# Patient Record
Sex: Female | Born: 1965 | Race: Black or African American | Hispanic: No | Marital: Single | Smoking: Never smoker
Health system: Southern US, Community
[De-identification: ages and names within clinical notes are randomized; demographics above are authoritative.]

---

## 2007-05-25 ENCOUNTER — Emergency Department (HOSPITAL_COMMUNITY): Admission: EM | Admit: 2007-05-25 | Discharge: 2007-05-26 | Payer: Self-pay | Admitting: Emergency Medicine

## 2009-04-28 ENCOUNTER — Emergency Department (HOSPITAL_COMMUNITY): Admission: EM | Admit: 2009-04-28 | Discharge: 2009-04-28 | Payer: Self-pay | Admitting: Emergency Medicine

## 2009-05-04 ENCOUNTER — Encounter (INDEPENDENT_AMBULATORY_CARE_PROVIDER_SITE_OTHER): Payer: Self-pay | Admitting: Internal Medicine

## 2009-05-04 ENCOUNTER — Inpatient Hospital Stay (HOSPITAL_COMMUNITY): Admission: EM | Admit: 2009-05-04 | Discharge: 2009-05-06 | Payer: Self-pay | Admitting: Emergency Medicine

## 2009-05-04 ENCOUNTER — Ambulatory Visit: Payer: Self-pay | Admitting: Cardiology

## 2009-06-19 ENCOUNTER — Encounter: Admission: RE | Admit: 2009-06-19 | Discharge: 2009-06-19 | Payer: Self-pay | Admitting: Family Medicine

## 2009-06-28 ENCOUNTER — Emergency Department (HOSPITAL_COMMUNITY): Admission: EM | Admit: 2009-06-28 | Discharge: 2009-06-28 | Payer: Self-pay | Admitting: Emergency Medicine

## 2010-01-16 ENCOUNTER — Emergency Department (HOSPITAL_COMMUNITY): Admission: EM | Admit: 2010-01-16 | Discharge: 2010-01-16 | Payer: Self-pay | Admitting: Emergency Medicine

## 2010-04-12 ENCOUNTER — Emergency Department (HOSPITAL_COMMUNITY): Admission: EM | Admit: 2010-04-12 | Discharge: 2010-04-12 | Payer: Self-pay | Admitting: Emergency Medicine

## 2010-04-17 ENCOUNTER — Ambulatory Visit (HOSPITAL_COMMUNITY)
Admission: RE | Admit: 2010-04-17 | Discharge: 2010-04-17 | Payer: Self-pay | Source: Home / Self Care | Admitting: Family Medicine

## 2010-06-23 ENCOUNTER — Encounter: Payer: Self-pay | Admitting: Family Medicine

## 2010-08-13 LAB — COMPREHENSIVE METABOLIC PANEL
ALT: 16 U/L (ref 0–35)
AST: 24 U/L (ref 0–37)
Alkaline Phosphatase: 85 U/L (ref 39–117)
BUN: 8 mg/dL (ref 6–23)
CO2: 25 mEq/L (ref 19–32)
Calcium: 9.5 mg/dL (ref 8.4–10.5)
Chloride: 107 mEq/L (ref 96–112)
Creatinine, Ser: 1.07 mg/dL (ref 0.4–1.2)
Total Protein: 7.2 g/dL (ref 6.0–8.3)

## 2010-08-13 LAB — DIFFERENTIAL
Basophils Relative: 1 % (ref 0–1)
Eosinophils Absolute: 0.3 10*3/uL (ref 0.0–0.7)
Eosinophils Relative: 3 % (ref 0–5)
Lymphs Abs: 4.5 10*3/uL — ABNORMAL HIGH (ref 0.7–4.0)
Monocytes Absolute: 0.5 10*3/uL (ref 0.1–1.0)
Monocytes Relative: 5 % (ref 3–12)
Neutrophils Relative %: 47 % (ref 43–77)

## 2010-08-13 LAB — CBC
MCH: 30.1 pg (ref 26.0–34.0)
MCV: 89.9 fL (ref 78.0–100.0)
Platelets: 239 10*3/uL (ref 150–400)
RBC: 4.3 MIL/uL (ref 3.87–5.11)

## 2010-08-13 LAB — POCT CARDIAC MARKERS
CKMB, poc: <1 ng/mL — ABNORMAL LOW (ref 1.0–8.0)
Myoglobin, poc: 49.3 ng/mL (ref 12–200)
Troponin i, poc: <0.05 ng/mL (ref 0.00–0.09)
Troponin i, poc: <0.05 ng/mL (ref 0.00–0.09)

## 2010-08-16 LAB — POCT I-STAT, CHEM 8
Hemoglobin: 12.9 g/dL (ref 12.0–15.0)
Potassium: 3.9 mEq/L (ref 3.5–5.1)
Sodium: 139 mEq/L (ref 135–145)
TCO2: 25 mmol/L (ref 0–100)

## 2010-08-16 LAB — DIFFERENTIAL
Basophils Absolute: 0 10*3/uL (ref 0.0–0.1)
Lymphocytes Relative: 39 % (ref 12–46)
Lymphs Abs: 2.7 10*3/uL (ref 0.7–4.0)
Monocytes Absolute: 0.4 10*3/uL (ref 0.1–1.0)
Neutro Abs: 3.7 10*3/uL (ref 1.7–7.7)

## 2010-08-16 LAB — URINALYSIS, ROUTINE W REFLEX MICROSCOPIC
Bilirubin Urine: NEGATIVE
Ketones, ur: NEGATIVE mg/dL
Nitrite: NEGATIVE
Protein, ur: NEGATIVE mg/dL
Urobilinogen, UA: 1 mg/dL (ref 0.0–1.0)
pH: 7.5 (ref 5.0–8.0)

## 2010-08-16 LAB — PROTIME-INR: Prothrombin Time: 13.1 seconds (ref 11.6–15.2)

## 2010-08-16 LAB — CBC
HCT: 35.8 % — ABNORMAL LOW (ref 36.0–46.0)
RBC: 4.09 MIL/uL (ref 3.87–5.11)
RDW: 13.4 % (ref 11.5–15.5)
WBC: 7.1 10*3/uL (ref 4.0–10.5)

## 2010-08-16 LAB — URINE CULTURE: Culture  Setup Time: 201108172008

## 2010-08-16 LAB — APTT: aPTT: 29 seconds (ref 24–37)

## 2010-09-03 LAB — COMPREHENSIVE METABOLIC PANEL
ALT: <8 U/L (ref 0–35)
AST: 28 U/L (ref 0–37)
Alkaline Phosphatase: 103 U/L (ref 39–117)
CO2: 19 mEq/L (ref 19–32)
Chloride: 105 mEq/L (ref 96–112)
GFR calc Af Amer: >60 mL/min (ref >60)
GFR calc non Af Amer: 56 mL/min — ABNORMAL LOW (ref >60)
Potassium: 3.8 mEq/L (ref 3.5–5.1)
Sodium: 137 mEq/L (ref 135–145)
Total Bilirubin: 0.4 mg/dL (ref 0.3–1.2)

## 2010-09-03 LAB — DIFFERENTIAL
Basophils Absolute: 0.1 10*3/uL (ref 0.0–0.1)
Basophils Relative: 1 % (ref 0–1)
Eosinophils Absolute: 0 10*3/uL (ref 0.0–0.7)
Eosinophils Relative: 0 % (ref 0–5)
Lymphs Abs: 0.7 10*3/uL (ref 0.7–4.0)

## 2010-09-03 LAB — BASIC METABOLIC PANEL
BUN: 10 mg/dL (ref 6–23)
CO2: 23 mEq/L (ref 19–32)
Calcium: 8.8 mg/dL (ref 8.4–10.5)
Creatinine, Ser: 0.84 mg/dL (ref 0.4–1.2)
GFR calc Af Amer: >60 mL/min (ref >60)
Glucose, Bld: 137 mg/dL — ABNORMAL HIGH (ref 70–99)

## 2010-09-03 LAB — CBC
RBC: 4.56 MIL/uL (ref 3.87–5.11)
WBC: 11 10*3/uL — ABNORMAL HIGH (ref 4.0–10.5)

## 2010-09-03 LAB — CARDIAC PANEL(CRET KIN+CKTOT+MB+TROPI)
CK, MB: 5.1 ng/mL — ABNORMAL HIGH (ref 0.3–4.0)
Relative Index: 3.5 — ABNORMAL HIGH (ref 0.0–2.5)
Relative Index: 4.1 — ABNORMAL HIGH (ref 0.0–2.5)
Total CK: 123 U/L (ref 7–177)
Total CK: 137 U/L (ref 7–177)
Troponin I: 0.07 ng/mL — ABNORMAL HIGH (ref 0.00–0.06)

## 2010-09-03 LAB — D-DIMER, QUANTITATIVE: D-Dimer, Quant: 0.55 ug/mL-FEU — ABNORMAL HIGH (ref 0.00–0.48)

## 2011-11-11 ENCOUNTER — Emergency Department (HOSPITAL_COMMUNITY)
Admission: EM | Admit: 2011-11-11 | Discharge: 2011-11-12 | Disposition: A | Discharge status: Home / Self Care | Payer: BC Managed Care – PPO | Attending: Emergency Medicine | Admitting: Emergency Medicine

## 2011-11-11 ENCOUNTER — Encounter (HOSPITAL_COMMUNITY): Payer: Self-pay | Admitting: *Deleted

## 2011-11-11 DIAGNOSIS — R110 Nausea: Secondary | ICD-10-CM | POA: Insufficient documentation

## 2011-11-11 DIAGNOSIS — R51 Headache: Secondary | ICD-10-CM | POA: Insufficient documentation

## 2011-11-11 NOTE — ED Notes (Signed)
Pt c/o headache; ringing in ears; nauseated; elevated bp today; symptoms started yesterday; pt took norvasc/metoprolol prior to arrival (mother's meds)

## 2011-11-12 ENCOUNTER — Emergency Department (HOSPITAL_COMMUNITY): Payer: BC Managed Care – PPO

## 2011-11-12 LAB — BASIC METABOLIC PANEL
BUN: 9 mg/dL (ref 6–23)
CO2: 24 mEq/L (ref 19–32)
Chloride: 103 mEq/L (ref 96–112)
Glucose, Bld: 103 mg/dL — ABNORMAL HIGH (ref 70–99)
Potassium: 3.9 mEq/L (ref 3.5–5.1)

## 2011-11-12 LAB — URINALYSIS, ROUTINE W REFLEX MICROSCOPIC
Bilirubin Urine: NEGATIVE
Glucose, UA: NEGATIVE mg/dL
Protein, ur: NEGATIVE mg/dL
Specific Gravity, Urine: 1.009 (ref 1.005–1.030)
Urobilinogen, UA: 0.2 mg/dL (ref 0.0–1.0)

## 2011-11-12 LAB — DIFFERENTIAL
Basophils Relative: 0 % (ref 0–1)
Eosinophils Absolute: 0.2 10*3/uL (ref 0.0–0.7)
Lymphs Abs: 2.4 10*3/uL (ref 0.7–4.0)
Monocytes Absolute: 0.4 10*3/uL (ref 0.1–1.0)
Monocytes Relative: 6 % (ref 3–12)

## 2011-11-12 LAB — URINE MICROSCOPIC-ADD ON

## 2011-11-12 LAB — CBC
HCT: 36.8 % (ref 36.0–46.0)
Hemoglobin: 12.1 g/dL (ref 12.0–15.0)
MCH: 27.8 pg (ref 26.0–34.0)
MCHC: 32.9 g/dL (ref 30.0–36.0)
RBC: 4.36 MIL/uL (ref 3.87–5.11)

## 2011-11-12 MED ORDER — ONDANSETRON HCL 4 MG/2ML IJ SOLN
INTRAMUSCULAR | Status: AC
  Administered 2011-11-12: 4 mg
  Filled 2011-11-12: qty 2

## 2011-11-12 MED ORDER — HYDROMORPHONE HCL PF 1 MG/ML IJ SOLN
INTRAMUSCULAR | Status: AC
  Administered 2011-11-12: 1 mg
  Filled 2011-11-12: qty 1

## 2011-11-12 NOTE — ED Notes (Signed)
Pt reports that she started having "pounding headache" yesterday that was not relieved by Aleve and Baby Aspirin--- took her BP with her mom's BP apparatus and she has had 165/100. Pt c/o constant headache and "ringing in her ears".

## 2011-11-12 NOTE — Discharge Instructions (Signed)
Headaches, Frequently Asked Questions MIGRAINE HEADACHES Q: What is migraine? What causes it? How can I treat it? A: Generally, migraine headaches begin as a dull ache. Then they develop into a constant, throbbing, and pulsating pain. You may experience pain at the temples. You may experience pain at the front or back of one or both sides of the head. The pain is usually accompanied by a combination of:  Nausea.   Vomiting.   Sensitivity to light and noise.  Some people (about 15%) experience an aura (see below) before an attack. The cause of migraine is believed to be chemical reactions in the brain. Treatment for migraine may include over-the-counter or prescription medications. It may also include self-help techniques. These include relaxation training and biofeedback.  Q: What is an aura? A: About 15% of people with migraine get an "aura". This is a sign of neurological symptoms that occur before a migraine headache. You may see wavy or jagged lines, dots, or flashing lights. You might experience tunnel vision or blind spots in one or both eyes. The aura can include visual or auditory hallucinations (something imagined). It may include disruptions in smell (such as strange odors), taste or touch. Other symptoms include:  Numbness.   A "pins and needles" sensation.   Difficulty in recalling or speaking the correct word.  These neurological events may last as long as 60 minutes. These symptoms will fade as the headache begins. Q: What is a trigger? A: Certain physical or environmental factors can lead to or "trigger" a migraine. These include:  Foods.   Hormonal changes.   Weather.   Stress.  It is important to remember that triggers are different for everyone. To help prevent migraine attacks, you need to figure out which triggers affect you. Keep a headache diary. This is a good way to track triggers. The diary will help you talk to your healthcare professional about your  condition. Q: Does weather affect migraines? A: Bright sunshine, hot, humid conditions, and drastic changes in barometric pressure may lead to, or "trigger," a migraine attack in some people. But studies have shown that weather does not act as a trigger for everyone with migraines. Q: What is the link between migraine and hormones? A: Hormones start and regulate many of your body's functions. Hormones keep your body in balance within a constantly changing environment. The levels of hormones in your body are unbalanced at times. Examples are during menstruation, pregnancy, or menopause. That can lead to a migraine attack. In fact, about three quarters of all women with migraine report that their attacks are related to the menstrual cycle.  Q: Is there an increased risk of stroke for migraine sufferers? A: The likelihood of a migraine attack causing a stroke is very remote. That is not to say that migraine sufferers cannot have a stroke associated with their migraines. In persons under age 40, the most common associated factor for stroke is migraine headache. But over the course of a person's normal life span, the occurrence of migraine headache may actually be associated with a reduced risk of dying from cerebrovascular disease due to stroke.  Q: What are acute medications for migraine? A: Acute medications are used to treat the pain of the headache after it has started. Examples over-the-counter medications, NSAIDs, ergots, and triptans.  Q: What are the triptans? A: Triptans are the newest class of abortive medications. They are specifically targeted to treat migraine. Triptans are vasoconstrictors. They moderate some chemical reactions in the brain.   The triptans work on receptors in your brain. Triptans help to restore the balance of a neurotransmitter called serotonin. Fluctuations in levels of serotonin are thought to be a main cause of migraine.  Q: Are over-the-counter medications for migraine  effective? A: Over-the-counter, or "OTC," medications may be effective in relieving mild to moderate pain and associated symptoms of migraine. But you should see your caregiver before beginning any treatment regimen for migraine.  Q: What are preventive medications for migraine? A: Preventive medications for migraine are sometimes referred to as "prophylactic" treatments. They are used to reduce the frequency, severity, and length of migraine attacks. Examples of preventive medications include antiepileptic medications, antidepressants, beta-blockers, calcium channel blockers, and NSAIDs (nonsteroidal anti-inflammatory drugs). Q: Why are anticonvulsants used to treat migraine? A: During the past few years, there has been an increased interest in antiepileptic drugs for the prevention of migraine. They are sometimes referred to as "anticonvulsants". Both epilepsy and migraine may be caused by similar reactions in the brain.  Q: Why are antidepressants used to treat migraine? A: Antidepressants are typically used to treat people with depression. They may reduce migraine frequency by regulating chemical levels, such as serotonin, in the brain.  Q: What alternative therapies are used to treat migraine? A: The term "alternative therapies" is often used to describe treatments considered outside the scope of conventional Western medicine. Examples of alternative therapy include acupuncture, acupressure, and yoga. Another common alternative treatment is herbal therapy. Some herbs are believed to relieve headache pain. Always discuss alternative therapies with your caregiver before proceeding. Some herbal products contain arsenic and other toxins. TENSION HEADACHES Q: What is a tension-type headache? What causes it? How can I treat it? A: Tension-type headaches occur randomly. They are often the result of temporary stress, anxiety, fatigue, or anger. Symptoms include soreness in your temples, a tightening  band-like sensation around your head (a "vice-like" ache). Symptoms can also include a pulling feeling, pressure sensations, and contracting head and neck muscles. The headache begins in your forehead, temples, or the back of your head and neck. Treatment for tension-type headache may include over-the-counter or prescription medications. Treatment may also include self-help techniques such as relaxation training and biofeedback. CLUSTER HEADACHES Q: What is a cluster headache? What causes it? How can I treat it? A: Cluster headache gets its name because the attacks come in groups. The pain arrives with little, if any, warning. It is usually on one side of the head. A tearing or bloodshot eye and a runny nose on the same side of the headache may also accompany the pain. Cluster headaches are believed to be caused by chemical reactions in the brain. They have been described as the most severe and intense of any headache type. Treatment for cluster headache includes prescription medication and oxygen. SINUS HEADACHES Q: What is a sinus headache? What causes it? How can I treat it? A: When a cavity in the bones of the face and skull (a sinus) becomes inflamed, the inflammation will cause localized pain. This condition is usually the result of an allergic reaction, a tumor, or an infection. If your headache is caused by a sinus blockage, such as an infection, you will probably have a fever. An x-ray will confirm a sinus blockage. Your caregiver's treatment might include antibiotics for the infection, as well as antihistamines or decongestants.  REBOUND HEADACHES Q: What is a rebound headache? What causes it? How can I treat it? A: A pattern of taking acute headache medications too   often can lead to a condition known as "rebound headache." A pattern of taking too much headache medication includes taking it more than 2 days per week or in excessive amounts. That means more than the label or a caregiver advises.  With rebound headaches, your medications not only stop relieving pain, they actually begin to cause headaches. Doctors treat rebound headache by tapering the medication that is being overused. Sometimes your caregiver will gradually substitute a different type of treatment or medication. Stopping may be a challenge. Regularly overusing a medication increases the potential for serious side effects. Consult a caregiver if you regularly use headache medications more than 2 days per week or more than the label advises. ADDITIONAL QUESTIONS AND ANSWERS Q: What is biofeedback? A: Biofeedback is a self-help treatment. Biofeedback uses special equipment to monitor your body's involuntary physical responses. Biofeedback monitors:  Breathing.   Pulse.   Heart rate.   Temperature.   Muscle tension.   Brain activity.  Biofeedback helps you refine and perfect your relaxation exercises. You learn to control the physical responses that are related to stress. Once the technique has been mastered, you do not need the equipment any more. Q: Are headaches hereditary? A: Four out of five (80%) of people that suffer report a family history of migraine. Scientists are not sure if this is genetic or a family predisposition. Despite the uncertainty, a child has a 50% chance of having migraine if one parent suffers. The child has a 75% chance if both parents suffer.  Q: Can children get headaches? A: By the time they reach high school, most young people have experienced some type of headache. Many safe and effective approaches or medications can prevent a headache from occurring or stop it after it has begun.  Q: What type of doctor should I see to diagnose and treat my headache? A: Start with your primary caregiver. Discuss his or her experience and approach to headaches. Discuss methods of classification, diagnosis, and treatment. Your caregiver may decide to recommend you to a headache specialist, depending upon  your symptoms or other physical conditions. Having diabetes, allergies, etc., may require a more comprehensive and inclusive approach to your headache. The [employer] will provide, upon request, a list of NHF physician members in your state. Document Released: 08/09/2003 Document Revised: 05/08/2011 Document Reviewed: 01/17/2008 Exit[employer] Patient Information 2012 [employer].Headache, General, Unknown Cause The specific cause of your headache may not have been found today. There are many causes and types of headache. A few common ones are:  Tension headache.   Migraine.   Infections (examples: dental and sinus infections).   Bone and/or joint problems in the neck or jaw.   Depression.   Eye problems.  These headaches are not life threatening.  Headaches can sometimes be diagnosed by a patient history and a physical exam. Sometimes, lab and imaging studies (such as x-ray and/or CT scan) are used to rule out more serious problems. In some cases, a spinal tap (lumbar puncture) may be requested. There are many times when your exam and tests may be normal on the first visit even when there is a serious problem causing your headaches. Because of that, it is very important to follow up with your doctor or local clinic for further evaluation. FINDING OUT THE RESULTS OF TESTS  If a radiology test was performed, a radiologist will review your results.   You will be contacted by the emergency department or your physician if any test results   require a change in your treatment plan.   Not all test results may be available during your visit. If your test results are not back during the visit, make an appointment with your caregiver to find out the results. Do not assume everything is normal if you have not heard from your caregiver or the medical facility. It is important for you to follow up on all of your test results.  HOME CARE INSTRUCTIONS   Keep follow-up appointments with  your caregiver, or any specialist referral.   Only take over-the-counter or prescription medicines for pain, discomfort, or fever as directed by your caregiver.   Biofeedback, massage, or other relaxation techniques may be helpful.   Ice packs or heat applied to the head and neck can be used. Do this three to four times per day, or as needed.   Call your doctor if you have any questions or concerns.   If you smoke, you should quit.  SEEK MEDICAL CARE IF:   You develop problems with medications prescribed.   You do not respond to or obtain relief from medications.   You have a change from the usual headache.   You develop nausea or vomiting.  SEEK IMMEDIATE MEDICAL CARE IF:   If your headache becomes severe.   You have an unexplained oral temperature above 102 F (38.9 C), or as your caregiver suggests.   You have a stiff neck.   You have loss of vision.   You have muscular weakness.   You have loss of muscular control.   You develop severe symptoms different from your first symptoms.   You start losing your balance or have trouble walking.   You feel faint or pass out.  MAKE SURE YOU:   Understand these instructions.   Will watch your condition.   Will get help right away if you are not doing well or get worse.  Document Released: 05/19/2005 Document Revised: 05/08/2011 Document Reviewed: 01/06/2008 Exit[employer] Patient Information 2012 [employer]. 

## 2011-11-12 NOTE — ED Provider Notes (Signed)
History     CSN: [id number]  Arrival date & time 11/11/11  2241   First MD Initiated Contact with Patient 11/12/11 0143      Chief Complaint  Patient presents with  . Hypertension    (Consider location/radiation/quality/duration/timing/severity/associated sxs/prior treatment) HPI Comments: Patient here with headache that started this morning - she states that the pain initially started in the occipital area and then radiated around to bilateral temporal area, reports this is throbbing in nature and associated with ringing in her ears and nausea.  She states that because of this she decided to check her blood pressure - states that is was elevated to 165/100 - states no prior history of HTN, she took a dose of her mother's norvasc and then came here.  She denies fever, chills, neck stiffness, vomiting, chest pain, shortness of breath, blurred vision, slurred speech, weakness.  Patient is a 46 y.o. female presenting with headaches. The history is provided by the patient. No language interpreter was used.  Headache  This is a new problem. The current episode started 12 to 24 hours ago. The problem occurs constantly. The problem has not changed since onset.The pain is located in the bilateral and temporal region. The quality of the pain is described as throbbing. The pain is at a severity of 8/10. The pain is moderate. The pain does not radiate. Associated symptoms include nausea. Pertinent negatives include no anorexia, no fever, no malaise/fatigue, no chest pressure, no near-syncope, no orthopnea, no palpitations, no syncope, no shortness of breath and no vomiting. She has tried nothing for the symptoms. The treatment provided no relief.    History reviewed. No pertinent past medical history.  History reviewed. No pertinent past surgical history.  No family history on file.  History  Substance Use Topics  . Smoking status: Never Smoker   . Smokeless tobacco: Not on file  . Alcohol Use:  No    OB History    Grav Para Term Preterm Abortions TAB SAB Ect Mult Living                  Review of Systems  Constitutional: Negative for fever and malaise/fatigue.  Respiratory: Negative for shortness of breath.   Cardiovascular: Negative for palpitations, orthopnea, syncope and near-syncope.  Gastrointestinal: Positive for nausea. Negative for vomiting and anorexia.  Neurological: Positive for headaches.  All other systems reviewed and are negative.    Allergies  Review of patient's allergies indicates no known allergies.  Home Medications  No current outpatient prescriptions on file.  BP 156/73  Pulse 89  Temp 98 F (36.7 C) (Oral)  Resp 16  SpO2 100%  LMP 10/11/2011  Physical Exam  Nursing note and vitals reviewed. Constitutional: She is oriented to person, place, and time. She appears well-developed and well-nourished. No distress.  HENT:  Head: Normocephalic and atraumatic.  Right Ear: External ear normal.  Left Ear: External ear normal.  Nose: Nose normal.  Mouth/Throat: Oropharynx is clear and moist. No oropharyngeal exudate.  Eyes: Conjunctivae are normal. Pupils are equal, round, and reactive to light. No scleral icterus.  Neck: Normal range of motion. Neck supple.  Cardiovascular: Normal rate, regular rhythm and normal heart sounds.  Exam reveals no gallop and no friction rub.   No murmur heard. Pulmonary/Chest: Effort normal and breath sounds normal. No respiratory distress. She has no wheezes. She has no rales. She exhibits no tenderness.  Abdominal: Soft. Bowel sounds are normal. She exhibits no distension. There is no  tenderness.  Musculoskeletal: Normal range of motion. She exhibits no edema and no tenderness.  Lymphadenopathy:    She has no cervical adenopathy.  Neurological: She is alert and oriented to person, place, and time. No cranial nerve deficit. She exhibits normal muscle tone. Coordination normal.  Skin: Skin is warm and dry. No rash  noted. No erythema. No pallor.  Psychiatric: She has a normal mood and affect. Her behavior is normal. Judgment and thought content normal.    ED Course  Procedures (including critical care time)  Labs Reviewed  BASIC METABOLIC PANEL - Abnormal; Notable for the following:    Glucose, Bld 103 (*)     All other components within normal limits  URINALYSIS, ROUTINE W REFLEX MICROSCOPIC - Abnormal; Notable for the following:    Hgb urine dipstick SMALL (*)     Leukocytes, UA SMALL (*)     All other components within normal limits  URINE MICROSCOPIC-ADD ON - Abnormal; Notable for the following:    Bacteria, UA FEW (*)     All other components within normal limits  CBC  DIFFERENTIAL   Ct Head Wo Contrast  11/12/2011  *RADIOLOGY REPORT*  Clinical Data: Headache.  Hypertension.  CT HEAD WITHOUT CONTRAST  Technique:  Contiguous axial images were obtained from the base of the skull through the vertex without contrast.  Comparison: 01/16/2010 and 06/19/2009  Findings: Bone windows demonstrate partial opacification of bilateral mastoid air cells.  Other paranasal sinuses clear.  Soft tissue windows demonstrate redemonstration of hypoattenuation in the periventricular white matter bilaterally.  This may be slightly improved since the prior.  No  mass lesion, hemorrhage, hydrocephalus, acute infarct, intra- axial, or extra-axial fluid collection.  IMPRESSION:  1. No acute intracranial abnormality. 2.  Similar to slight improvement in hypoattenuation in the periventricular white matter.  Differential considerations remain age advanced small vessel ischemic change versus demyelinating process. 3.  Similar chronic partial opacification of ethmoid air cells.  Original Report Authenticated By: Consuello Bossier, M.D.   Results for orders placed during the hospital encounter of 11/11/11  BASIC METABOLIC PANEL      Component Value Range   Sodium 136  135 - 145 mEq/L   Potassium 3.9  3.5 - 5.1 mEq/L   Chloride  103  96 - 112 mEq/L   CO2 24  19 - 32 mEq/L   Glucose, Bld 103 (*) 70 - 99 mg/dL   BUN 9  6 - 23 mg/dL   Creatinine, Ser 1.61  0.50 - 1.10 mg/dL   Calcium 9.6  8.4 - 09.6 mg/dL   GFR calc non Af Amer >90  >90 mL/min   GFR calc Af Amer >90  >90 mL/min  CBC      Component Value Range   WBC 6.9  4.0 - 10.5 K/uL   RBC 4.36  3.87 - 5.11 MIL/uL   Hemoglobin 12.1  12.0 - 15.0 g/dL   HCT 04.5  40.9 - 81.1 %   MCV 84.4  78.0 - 100.0 fL   MCH 27.8  26.0 - 34.0 pg   MCHC 32.9  30.0 - 36.0 g/dL   RDW 91.4  78.2 - 95.6 %   Platelets 249  150 - 400 K/uL  DIFFERENTIAL      Component Value Range   Neutrophils Relative 56  43 - 77 %   Neutro Abs 3.8  1.7 - 7.7 K/uL   Lymphocytes Relative 35  12 - 46 %   Lymphs Abs 2.4  0.7 - 4.0 K/uL   Monocytes Relative 6  3 - 12 %   Monocytes Absolute 0.4  0.1 - 1.0 K/uL   Eosinophils Relative 3  0 - 5 %   Eosinophils Absolute 0.2  0.0 - 0.7 K/uL   Basophils Relative 0  0 - 1 %   Basophils Absolute 0.0  0.0 - 0.1 K/uL  URINALYSIS, ROUTINE W REFLEX MICROSCOPIC      Component Value Range   Color, Urine YELLOW  YELLOW   APPearance CLEAR  CLEAR   Specific Gravity, Urine 1.009  1.005 - 1.030   pH 6.0  5.0 - 8.0   Glucose, UA NEGATIVE  NEGATIVE mg/dL   Hgb urine dipstick SMALL (*) NEGATIVE   Bilirubin Urine NEGATIVE  NEGATIVE   Ketones, ur NEGATIVE  NEGATIVE mg/dL   Protein, ur NEGATIVE  NEGATIVE mg/dL   Urobilinogen, UA 0.2  0.0 - 1.0 mg/dL   Nitrite NEGATIVE  NEGATIVE   Leukocytes, UA SMALL (*) NEGATIVE  URINE MICROSCOPIC-ADD ON      Component Value Range   Squamous Epithelial / LPF RARE  RARE   WBC, UA 0-2  <3 WBC/hpf   RBC / HPF 0-2  <3 RBC/hpf   Bacteria, UA FEW (*) RARE   Ct Head Wo Contrast  11/12/2011  *RADIOLOGY REPORT*  Clinical Data: Headache.  Hypertension.  CT HEAD WITHOUT CONTRAST  Technique:  Contiguous axial images were obtained from the base of the skull through the vertex without contrast.  Comparison: 01/16/2010 and 06/19/2009   Findings: Bone windows demonstrate partial opacification of bilateral mastoid air cells.  Other paranasal sinuses clear.  Soft tissue windows demonstrate redemonstration of hypoattenuation in the periventricular white matter bilaterally.  This may be slightly improved since the prior.  No  mass lesion, hemorrhage, hydrocephalus, acute infarct, intra- axial, or extra-axial fluid collection.  IMPRESSION:  1. No acute intracranial abnormality. 2.  Similar to slight improvement in hypoattenuation in the periventricular white matter.  Differential considerations remain age advanced small vessel ischemic change versus demyelinating process. 3.  Similar chronic partial opacification of ethmoid air cells.  Original Report Authenticated By: Consuello Bossier, M.D.      Headache   MDM  Patient here with headache with associated nausea and elevated blood pressure, was given medication here and feels better - repeat blood pressure 136/80.  I don't find a cause for the headache, but I doubt stroke, SAH, meningitis.  She has primary care and will follow up with him this week.        Izola Price [place], [place] 11/12/11 [phone]

## 2011-11-13 NOTE — ED Provider Notes (Signed)
Medical screening examination/treatment/procedure(s) were performed by non-physician practitioner and as supervising physician I was immediately available for consultation/collaboration.  Sunnie Nielsen, MD 11/13/11 [phone]

## 2011-12-01 ENCOUNTER — Other Ambulatory Visit: Payer: Self-pay | Admitting: Family Medicine

## 2011-12-01 ENCOUNTER — Other Ambulatory Visit (HOSPITAL_COMMUNITY)
Admission: RE | Admit: 2011-12-01 | Discharge: 2011-12-01 | Disposition: A | Discharge status: Home / Self Care | Payer: BC Managed Care – PPO | Source: Ambulatory Visit | Attending: Family Medicine | Admitting: Family Medicine

## 2011-12-01 DIAGNOSIS — Z1159 Encounter for screening for other viral diseases: Secondary | ICD-10-CM | POA: Insufficient documentation

## 2011-12-01 DIAGNOSIS — Z113 Encounter for screening for infections with a predominantly sexual mode of transmission: Secondary | ICD-10-CM | POA: Insufficient documentation

## 2011-12-01 DIAGNOSIS — Z124 Encounter for screening for malignant neoplasm of cervix: Secondary | ICD-10-CM | POA: Insufficient documentation

## 2013-02-15 IMAGING — CT CT HEAD W/O CM
2 series · 16 of 30 positions shown, 20 images · non-contrast
Comparison: 01/16/2010 and 06/19/2009

CLINICAL DATA: Headache.  Hypertension.

CT HEAD WITHOUT CONTRAST
TECHNIQUE: Contiguous axial images were obtained from the base of
the skull through the vertex without contrast.

[Series 2: head w/o · axial · non-contrast · 0.43mm/px · z∈[-143,-23]mm · 13 of 30 slices shown, 17 images]
[im 3/30  brain]
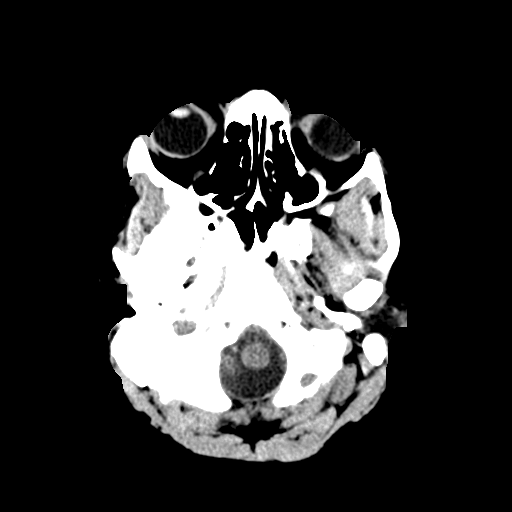
[im 3/30  bone]
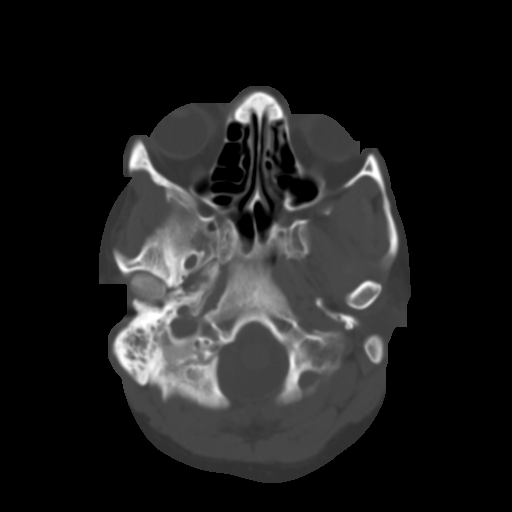
[im 5/30  brain]
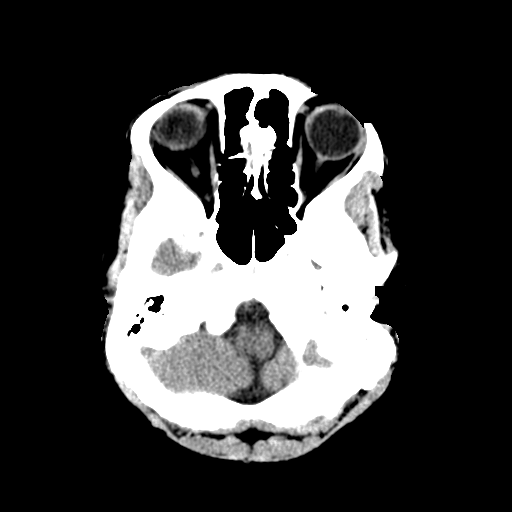
[im 7/30  brain]
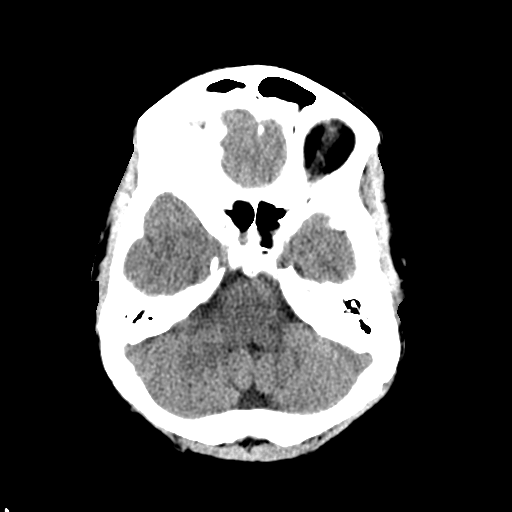
[im 9/30  brain]
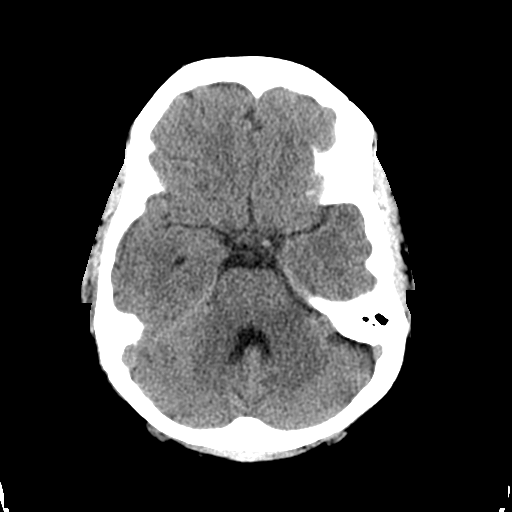
[im 11/30  brain]
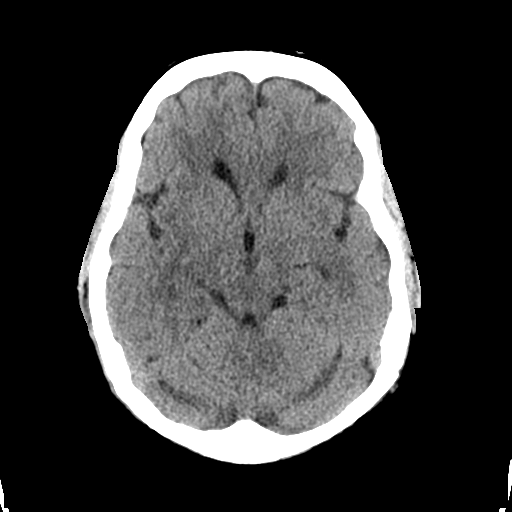
[im 11/30  bone]
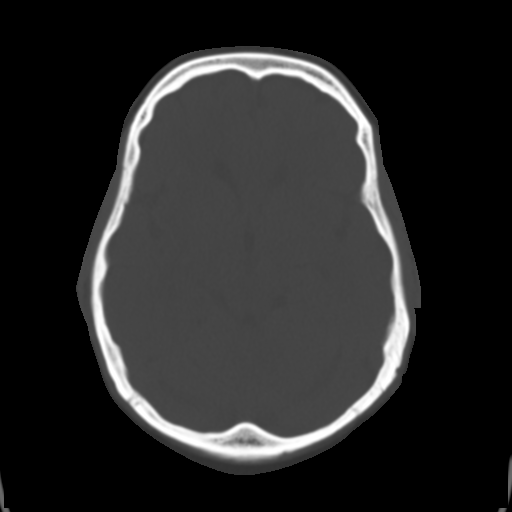
[im 13/30  brain]
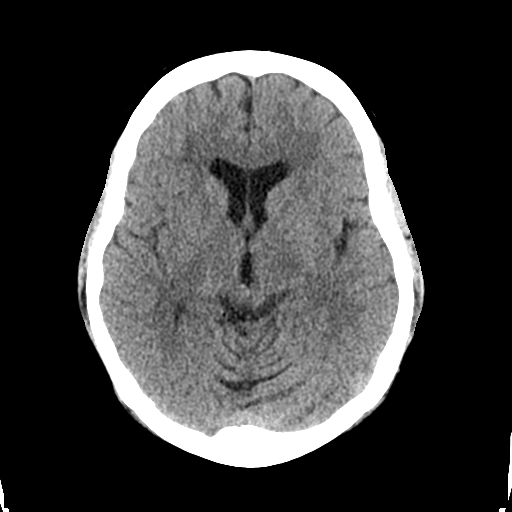
[im 15/30  brain]
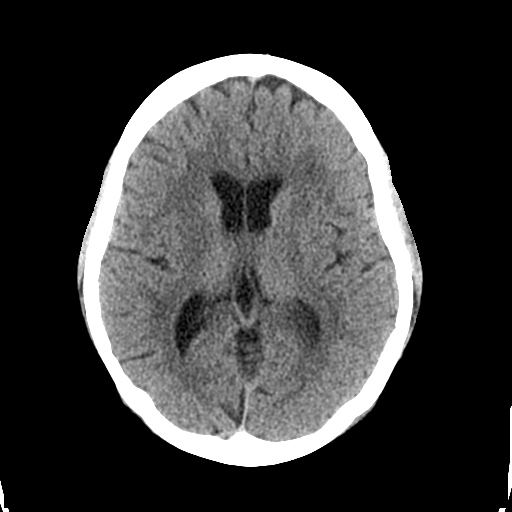
[im 17/30  brain]
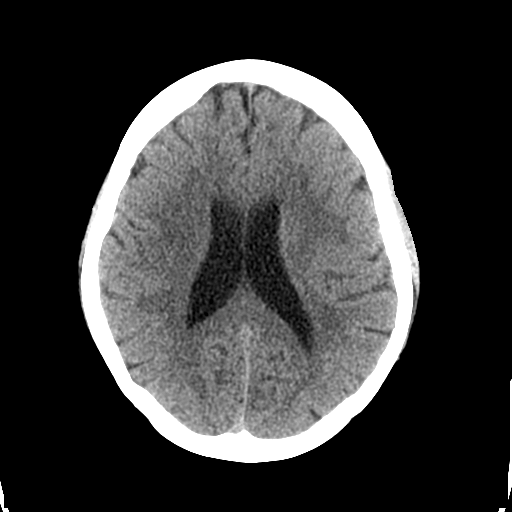
[im 19/30  brain]
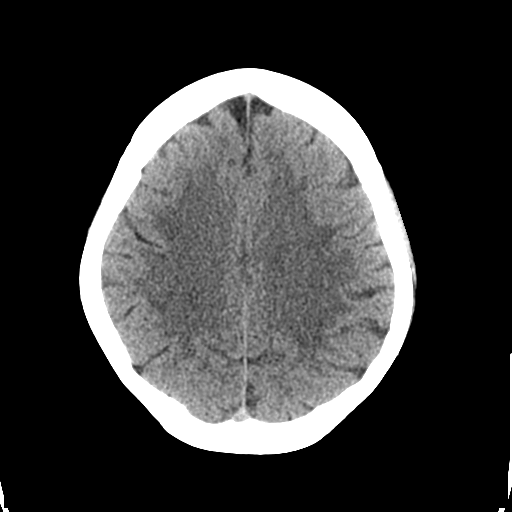
[im 19/30  bone]
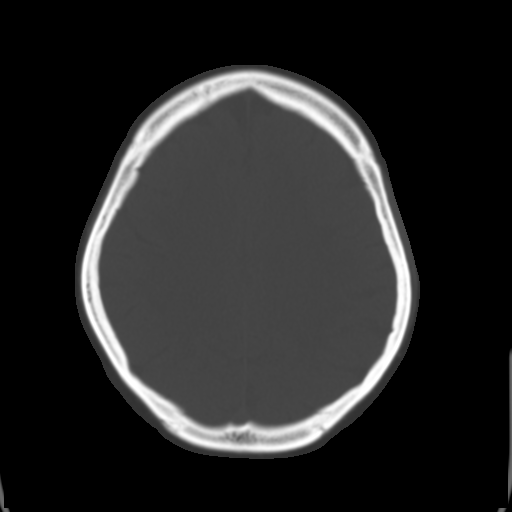
[im 21/30  brain]
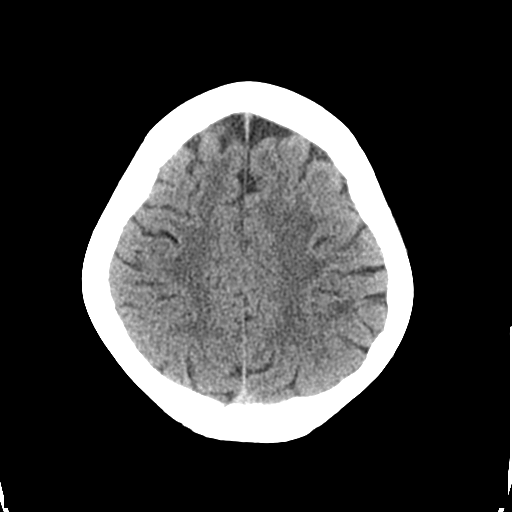
[im 23/30  brain]
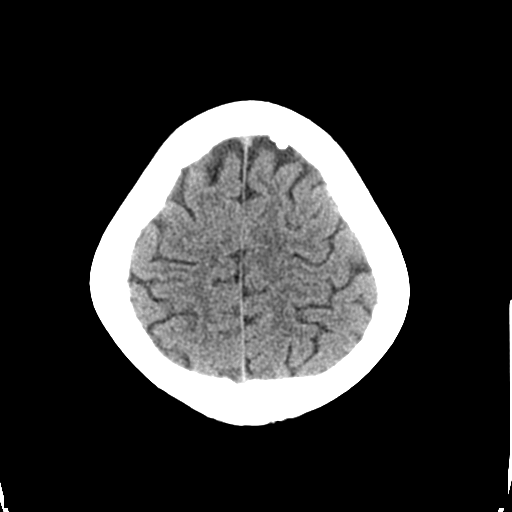
[im 25/30  brain]
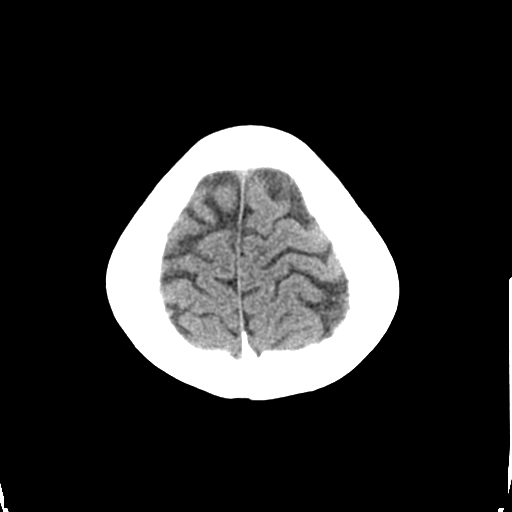
[im 27/30  brain]
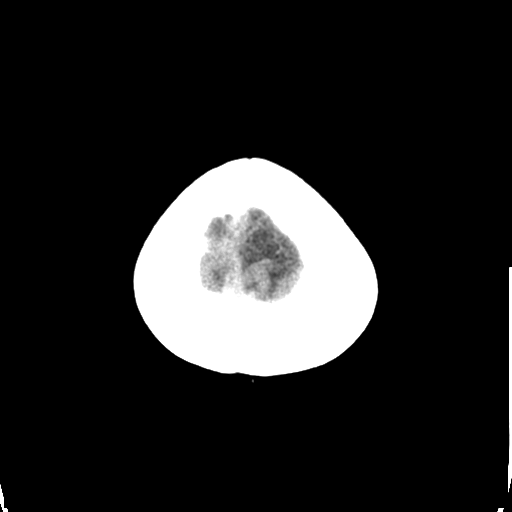
[im 27/30  bone]
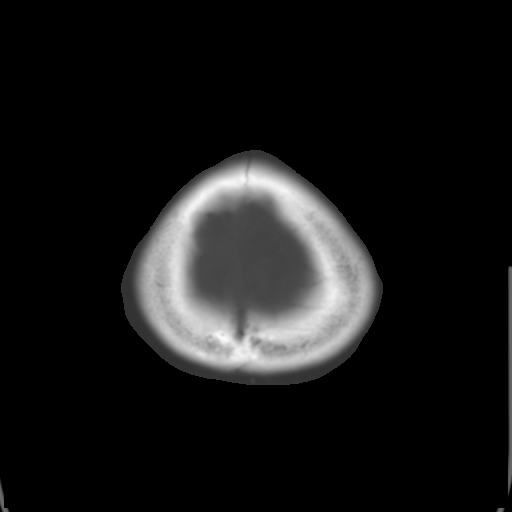

[Series 3: bone windows · axial · 0.43mm/px · z∈[-143,-103]mm · 3 of 30 slices shown]
[im 3/30  bone]
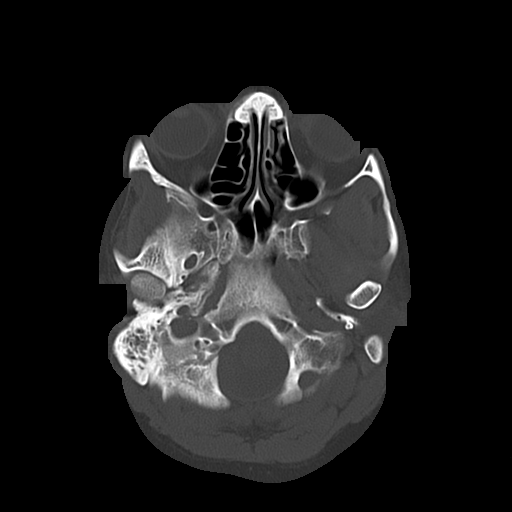
[im 7/30  bone]
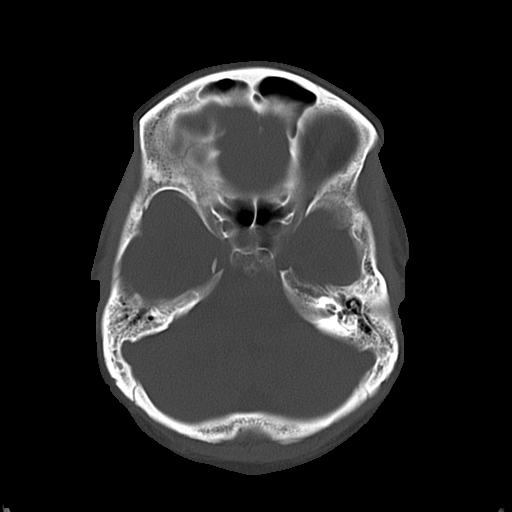
[im 11/30  bone]
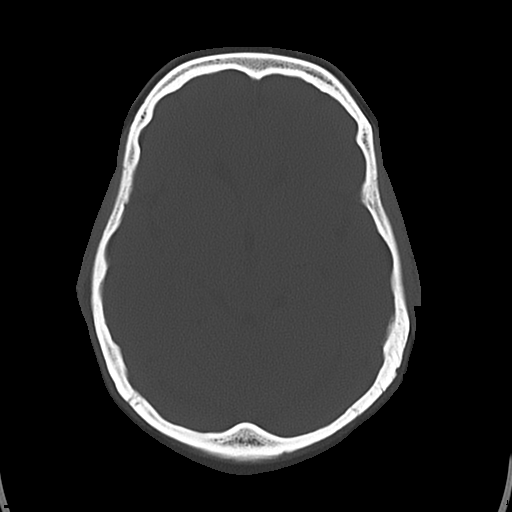

[16 of 30 positions shown; findings below may reference images not displayed]

FINDINGS: Bone windows demonstrate partial opacification of
bilateral mastoid air cells.  Other paranasal sinuses clear.

Soft tissue windows demonstrate redemonstration of hypoattenuation
in the periventricular white matter bilaterally.  This may be
slightly improved since the prior.

No  mass lesion, hemorrhage, hydrocephalus, acute infarct, intra-
axial, or extra-axial fluid collection.
IMPRESSION: 1. No acute intracranial abnormality.
2.  Similar to slight improvement in hypoattenuation in the
periventricular white matter.  Differential considerations remain
age advanced small vessel ischemic change versus demyelinating
process.
3.  Similar chronic partial opacification of ethmoid air cells.

## 2013-12-13 ENCOUNTER — Other Ambulatory Visit: Payer: Self-pay | Admitting: Family Medicine

## 2013-12-13 DIAGNOSIS — H6191 Disorder of right external ear, unspecified: Secondary | ICD-10-CM

## 2013-12-16 ENCOUNTER — Ambulatory Visit
Admission: RE | Admit: 2013-12-16 | Discharge: 2013-12-16 | Disposition: A | Discharge status: Home / Self Care | Payer: BC Managed Care – PPO | Source: Ambulatory Visit | Attending: Family Medicine | Admitting: Family Medicine

## 2013-12-16 DIAGNOSIS — H6191 Disorder of right external ear, unspecified: Secondary | ICD-10-CM

## 2014-03-02 ENCOUNTER — Other Ambulatory Visit: Payer: Self-pay | Admitting: Family Medicine

## 2014-03-02 DIAGNOSIS — E049 Nontoxic goiter, unspecified: Secondary | ICD-10-CM

## 2015-03-22 IMAGING — US US SOFT TISSUE HEAD/NECK
1 series · 13 of 23 positions shown · non-contrast
Comparison: Head CT 11/12/2011 and earlier

CLINICAL DATA: Lump anterior to the right ear for 2 months. No pain
or fever. Evaluate cyst versus lymph node.

EXAM:
ULTRASOUND OF HEAD/NECK SOFT TISSUES
TECHNIQUE: Ultrasound examination of the head and neck soft tissues was
performed in the area of clinical concern.

[Series 1: us soft tissue head/neck · 0.04mm/px · 23 acquisitions, 13 frames shown]
[im 1/23]
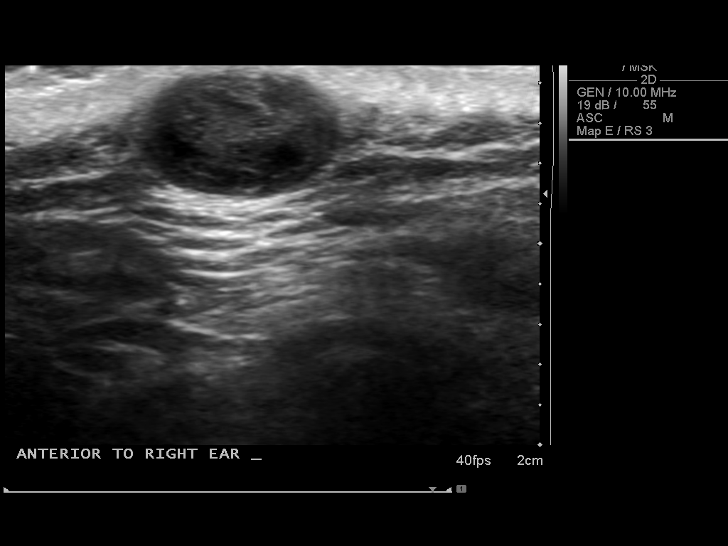
[im 3/23]
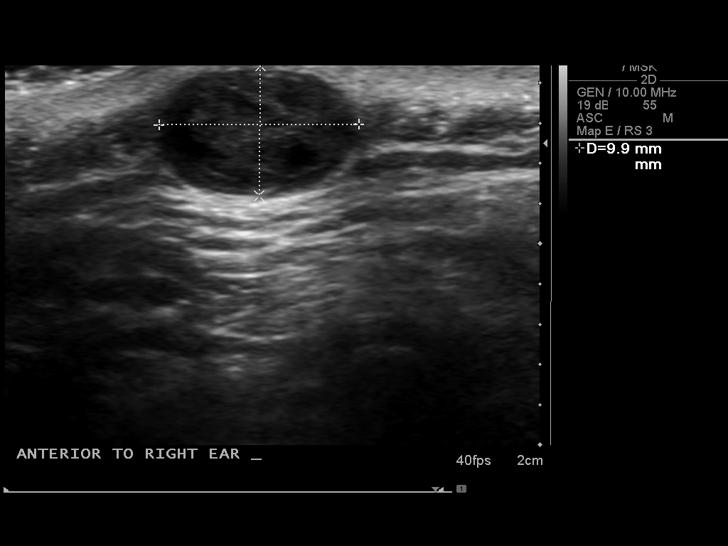
[im 5/23]
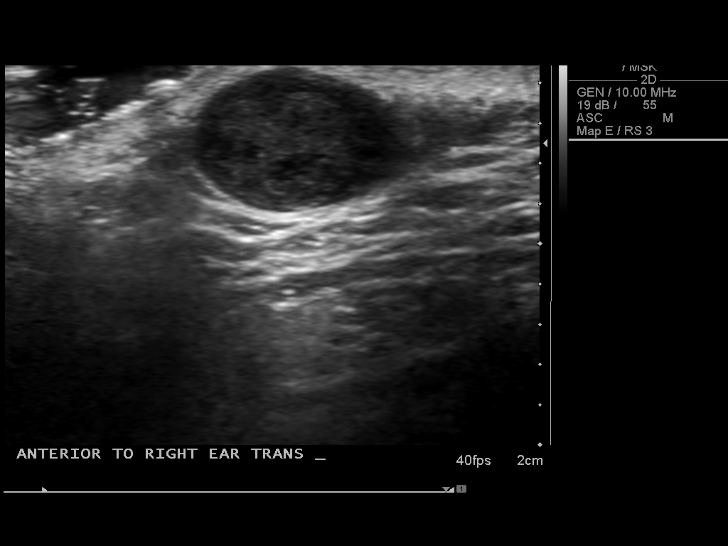
[im 7/23]
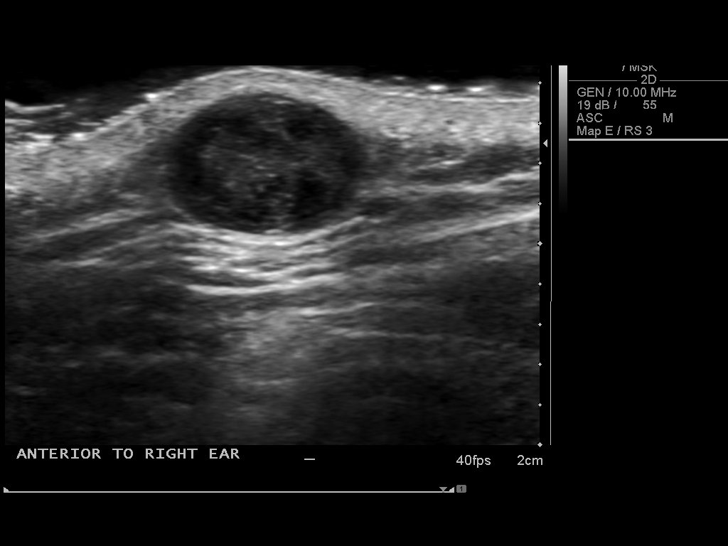
[im 8/23]
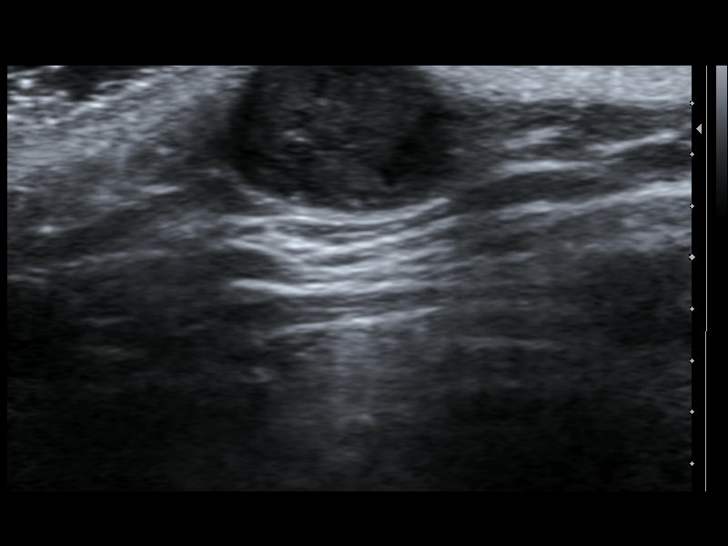
[im 10/23]
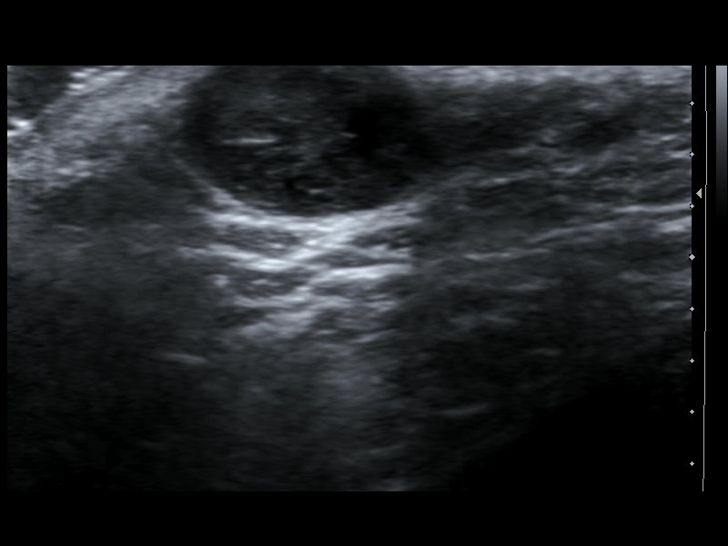
[im 12/23]
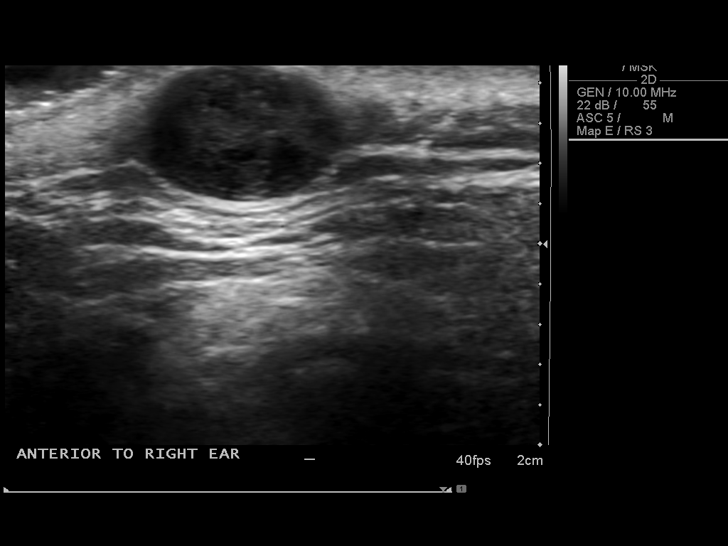
[im 14/23]
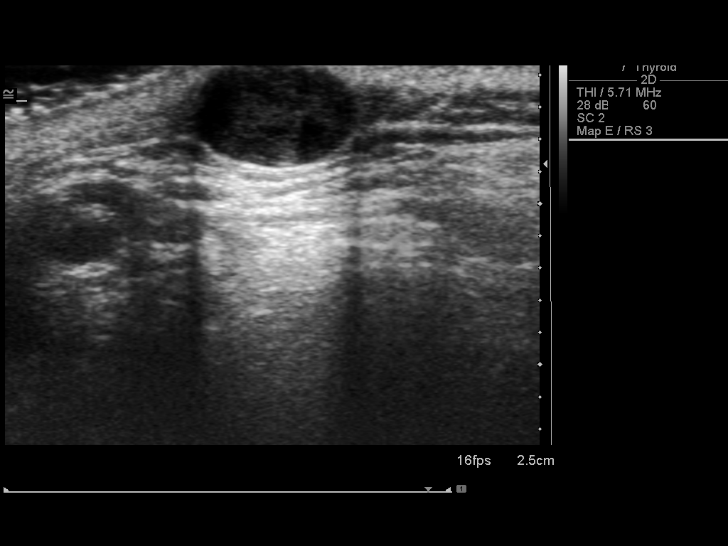
[im 16/23]
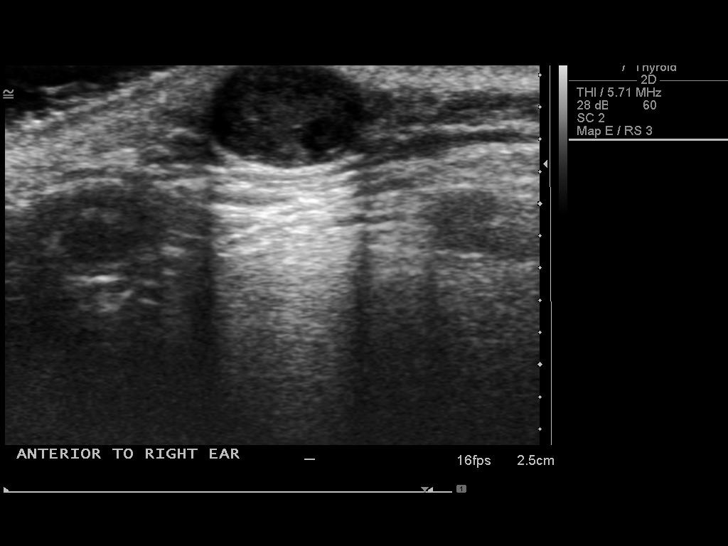
[im 17/23]
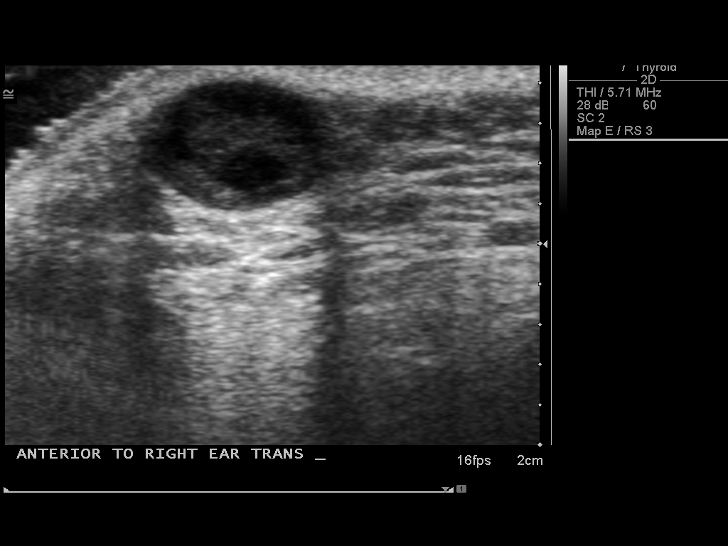
[im 19/23]
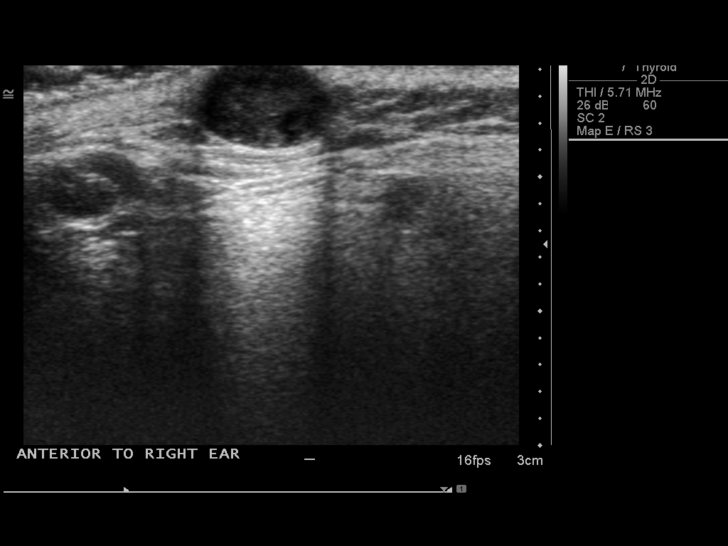
[im 21/23]
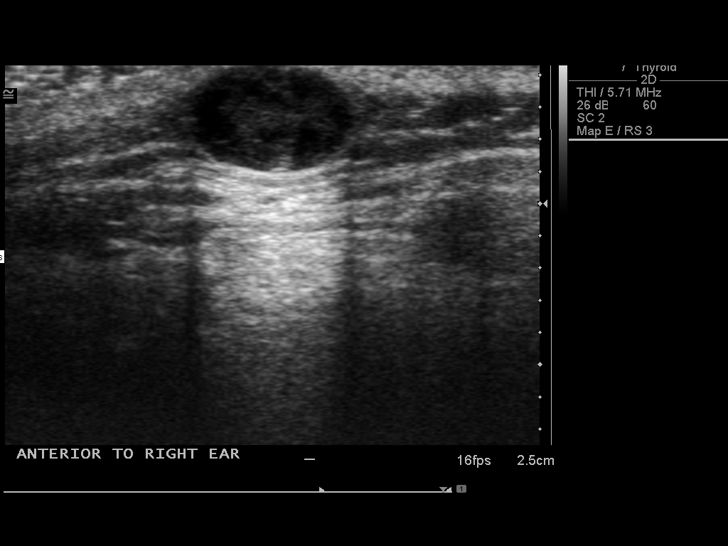
[im 23/23]
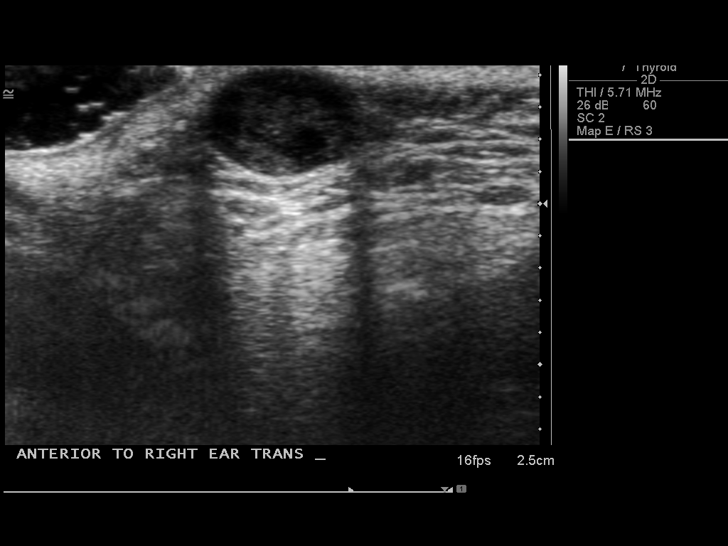

[13 of 23 positions shown; findings below may reference images not displayed]

FINDINGS: In the area of clinical concern anterior to the right ear is a
heterogeneously hypoechoic, oval, well-circumscribed subcutaneous
lesion measuring 10 x 7 x 10 mm. There is increased posterior
through transmission and no evidence of internal vascularity. This
appears separate from the parotid gland. This lesion abuts the
cutaneous layer of skin but no definite tract is identified leading
to the skin surface.
IMPRESSION: 10 mm oval nodule in the subcutaneous tissues anterior to the right
ear. There is no evidence of internal vascularity, and this may
reflect a complex sebaceous cyst.

A small, normal appearing lymph node was present in this region on
multiple prior head CTs, and the possibility that this represents an
enlarged, abnormal lymph node cannot be dismissed, despite the lack
of internal vascularity. Consider close clinical follow-up versus
further evaluation with contrast-enhanced neck CT.
